# Patient Record
Sex: Female | Born: 1995 | Race: White | Hispanic: No | Smoking: Never smoker
Health system: Southern US, Community
[De-identification: ages and names within clinical notes are randomized; demographics above are authoritative.]

## PROBLEM LIST (undated history)

## (undated) DIAGNOSIS — G932 Benign intracranial hypertension: Secondary | ICD-10-CM

## (undated) HISTORY — PX: HIP SURGERY: SHX245

## (undated) HISTORY — PX: NASAL SINUS SURGERY: SHX719

---

## 2016-04-14 ENCOUNTER — Emergency Department (HOSPITAL_COMMUNITY)
Admission: EM | Admit: 2016-04-14 | Discharge: 2016-04-14 | Disposition: A | Discharge status: Home / Self Care | Payer: BLUE CROSS/BLUE SHIELD | Attending: Emergency Medicine | Admitting: Emergency Medicine

## 2016-04-14 ENCOUNTER — Emergency Department (HOSPITAL_COMMUNITY): Payer: BLUE CROSS/BLUE SHIELD

## 2016-04-14 ENCOUNTER — Encounter (HOSPITAL_COMMUNITY): Payer: Self-pay | Admitting: Nurse Practitioner

## 2016-04-14 DIAGNOSIS — R51 Headache: Secondary | ICD-10-CM | POA: Insufficient documentation

## 2016-04-14 DIAGNOSIS — R200 Anesthesia of skin: Secondary | ICD-10-CM | POA: Diagnosis not present

## 2016-04-14 DIAGNOSIS — R079 Chest pain, unspecified: Secondary | ICD-10-CM | POA: Insufficient documentation

## 2016-04-14 DIAGNOSIS — R519 Headache, unspecified: Secondary | ICD-10-CM

## 2016-04-14 HISTORY — DX: Benign intracranial hypertension: G93.2

## 2016-04-14 LAB — URINALYSIS, ROUTINE W REFLEX MICROSCOPIC
Bilirubin Urine: NEGATIVE
GLUCOSE, UA: NEGATIVE mg/dL
Hgb urine dipstick: NEGATIVE
KETONES UR: NEGATIVE mg/dL
LEUKOCYTES UA: NEGATIVE
Nitrite: NEGATIVE
PROTEIN: NEGATIVE mg/dL
Specific Gravity, Urine: 1.015 (ref 1.005–1.030)
pH: 7.5 (ref 5.0–8.0)

## 2016-04-14 LAB — I-STAT TROPONIN, ED: TROPONIN I, POC: 0 ng/mL (ref 0.00–0.08)

## 2016-04-14 LAB — CBC
HCT: 39.9 % (ref 36.0–46.0)
HEMOGLOBIN: 13.1 g/dL (ref 12.0–15.0)
MCH: 28.1 pg (ref 26.0–34.0)
MCHC: 32.8 g/dL (ref 30.0–36.0)
MCV: 85.6 fL (ref 78.0–100.0)
Platelets: 312 10*3/uL (ref 150–400)
RBC: 4.66 MIL/uL (ref 3.87–5.11)
RDW: 13.8 % (ref 11.5–15.5)
WBC: 7.6 10*3/uL (ref 4.0–10.5)

## 2016-04-14 LAB — BASIC METABOLIC PANEL
ANION GAP: 7 (ref 5–15)
BUN: 12 mg/dL (ref 6–20)
CALCIUM: 9.3 mg/dL (ref 8.9–10.3)
CO2: 21 mmol/L — ABNORMAL LOW (ref 22–32)
Chloride: 108 mmol/L (ref 101–111)
Creatinine, Ser: 0.78 mg/dL (ref 0.44–1.00)
Glucose, Bld: 105 mg/dL — ABNORMAL HIGH (ref 65–99)
Potassium: 3.6 mmol/L (ref 3.5–5.1)
SODIUM: 136 mmol/L (ref 135–145)

## 2016-04-14 LAB — PREGNANCY, URINE: Preg Test, Ur: NEGATIVE

## 2016-04-14 MED ORDER — GI COCKTAIL ~~LOC~~
30.0000 mL | Freq: Once | ORAL | Status: AC
  Administered 2016-04-14: 30 mL via ORAL
  Filled 2016-04-14: qty 30

## 2016-04-14 MED ORDER — KETOROLAC TROMETHAMINE 30 MG/ML IJ SOLN
30.0000 mg | Freq: Once | INTRAMUSCULAR | Status: AC
  Administered 2016-04-14: 30 mg via INTRAVENOUS
  Filled 2016-04-14: qty 1

## 2016-04-14 MED ORDER — SODIUM CHLORIDE 0.9 % IV BOLUS (SEPSIS)
1000.0000 mL | Freq: Once | INTRAVENOUS | Status: AC
Start: 2016-04-14 — End: 2016-04-14
  Administered 2016-04-14: 1000 mL via INTRAVENOUS

## 2016-04-14 NOTE — ED Provider Notes (Signed)
CSN: [id number]     Arrival date & time 04/14/16  1203 History  By signing my name below, I, Tanda RockersMargaux Venter, attest that this documentation has been prepared under the direction and in the presence of Azalia BilisKevin Breslyn Abdo, MD. Electronically Signed: Tanda RockersMargaux Venter, ED Scribe. 04/14/2016. 4:33 PM.   Chief Complaint  Patient presents with  . Numbness  . Chest Pain   The history is provided by the patient and a parent. No language interpreter was used.   HPI Comments: Brandy Perry is a 20 y.o. female with PMHx pseudotumor cerebri, who presents to the Emergency Department complaining of gradual onset, constant, waxing and waning, central chest pain radiating to left chest and underneath left breast x 1.5 weeks, gradually worsening. The chest pain is exacerbated with movement. She also complains of intermittent nausea, shortness of breath, bilateral shoulder pain, and headaches. Pt saw her PCP yesterday for this chest pain and had blood work done with no acute findings. Her PCP believed pt could either have a stomach ulcer or pleurisy and prescribe a medication that pt cannot recall the name of. Pt mentions that while at a therapy session today she began having trouble concentrating, aphasia, and had an outburst of crying when she was not feeling upset. She mentions that she had a "weird" feeling in her tongue and all 4 of her extremities. Pt also felt pre syncopal with dizziness. Mom states that pt has been in a lot of stress lately. Pt has been seeing the therapist after being separated approximately 6 months ago. [facility] [place]. Pt states that she has not had to be drained in awhile. Denies any other associated symptoms.    Past Medical History  Diagnosis Date  . Pseudotumor cerebri    Past Surgical History  Procedure Laterality Date  . Hip surgery    . Nasal sinus surgery     History reviewed. No pertinent family history. Social History   Substance Use Topics  . Smoking status: Never Smoker   . Smokeless tobacco: None  . Alcohol Use: Yes   OB History    No data available     Review of Systems A complete 10 system review of systems was obtained and all systems are negative except as noted in the HPI and PMH.   Allergies  Doxycycline; Nitric oxide; and Penicillins  Home Medications   Prior to Admission medications   Not on File   BP 114/74 mmHg  Pulse 110  Temp(Src) 98.3 F (36.8 C) (Oral)  Resp 18  Ht 5' 2.5" (1.588 m)  Wt 280 lb (127.007 kg)  BMI 50.36 kg/m2  SpO2 98%  LMP 03/28/2016   Physical Exam  Constitutional: She is oriented to person, place, and time. She appears well-developed and well-nourished. No distress.  HENT:  Head: Normocephalic and atraumatic.  Eyes: EOM are normal.  Neck: Normal range of motion.  Cardiovascular: Normal rate, regular rhythm and normal heart sounds.   Pulmonary/Chest: Effort normal and breath sounds normal.  Abdominal: Soft. She exhibits no distension. There is no tenderness.  Musculoskeletal: Normal range of motion.  Neurological: She is alert and oriented to person, place, and time.  Skin: Skin is warm and dry.  Psychiatric: Judgment normal. Her mood appears anxious.  Nursing note and vitals reviewed.   ED Course  Procedures (including critical care time)  DIAGNOSTIC STUDIES: Oxygen Saturation is 98% on RA, normal by my interpretation.    COORDINATION OF CARE: 4:31  PM-Discussed treatment plan which includes CT Head and IV Fluids with pt at bedside and pt agreed to plan.   Labs Review Labs Reviewed  BASIC METABOLIC PANEL - Abnormal; Notable for the following:    CO2 21 (*)    Glucose, Bld 105 (*)    All other components within normal limits  CBC  URINALYSIS, ROUTINE W REFLEX MICROSCOPIC (NOT AT [facility])  PREGNANCY, URINE  I-STAT TROPOININ, ED    Imaging Review Dg Chest 2 View  04/14/2016  CLINICAL DATA:  Chest pain EXAM: CHEST  2 VIEW COMPARISON:   None. FINDINGS: The heart size and mediastinal contours are within normal limits. Both lungs are clear. The visualized skeletal structures are unremarkable. IMPRESSION: No active cardiopulmonary disease. Electronically Signed   By: Marlan Palauharles  Clark M.D.   On: 04/14/2016 13:23   Ct Head Wo Contrast  04/14/2016  CLINICAL DATA:  Headache and dizziness 1-2 weeks. Tingling over face, tongue and extremities today. History of pseudotumor cerebri. EXAM: CT HEAD WITHOUT CONTRAST TECHNIQUE: Contiguous axial images were obtained from the base of the skull through the vertex without intravenous contrast. COMPARISON:  None. FINDINGS: Mild motion artifact. Ventricles, cisterns and other CSF spaces are within normal. No mass, mass effect, shift of midline structures or acute hemorrhage. No evidence of acute infarction. Remaining bones and soft tissues are within normal. IMPRESSION: No acute intracranial findings. Electronically Signed   By: Elberta Fortisaniel  Boyle M.D.   On: 04/14/2016 18:00   I have personally reviewed and evaluated these images and lab results as part of my medical decision-making.   EKG Interpretation   Date/Time:  Thursday April 14 2016 12:56:31 EDT Ventricular Rate:  102 PR Interval:  126 QRS Duration: 82 QT Interval:  352 QTC Calculation: 458 R Axis:   41 Text Interpretation:  Sinus tachycardia Otherwise normal ECG No old  tracing to compare Confirmed by Zubair Lofton  MD, Caryn BeeKEVIN ([id number]) on 04/14/2016  4:29:45 PM      MDM   Final diagnoses:  Chest pain, unspecified chest pain type  Headache, unspecified headache type    Patient is overall well-appearing.  Her multitude of symptoms are nonspecific.  Medical screening examination completed.  Doubt life-threatening emergency.  No indication for additional workup today in the emergency department.  No indication for hospitalization at this time.  Much of this may be related to increasing stress over the past several months.  Primary care follow-up.  She  understands to return to the ER for new or worsening symptoms   I personally performed the services described in this documentation, which was scribed in my presence. The recorded information has been reviewed and is accurate.        Azalia BilisKevin Vada Swift, MD 04/15/16 [phone]

## 2016-04-14 NOTE — ED Notes (Addendum)
Pt c/o onset numbness in her tongue, legs, and chest and upper back pain while at a counseling session. She states she felt anxious during the counseling session, but this did not feel like her typical anxiety. She reports CP, upper back pain, headaches over past week. She was evaluated for this by PCP yesterday but has not heard back on her test results yet. She is alert and breathing easily

## 2017-12-25 IMAGING — CT CT HEAD W/O CM
4 of 6 series · 15 of 47 positions shown, 17 images · non-contrast
Comparison: None.

CLINICAL DATA: Headache and dizziness 1-2 weeks. Tingling over
face, tongue and extremities today. History of pseudotumor cerebri.

EXAM:
CT HEAD WITHOUT CONTRAST
TECHNIQUE: Contiguous axial images were obtained from the base of the skull
through the vertex without intravenous contrast.

[Series 2: head without ax · axial · non-contrast · 0.35mm/px · z∈[+1453,+1542]mm · 4 of 32 slices shown]
[im 7/32  brain]
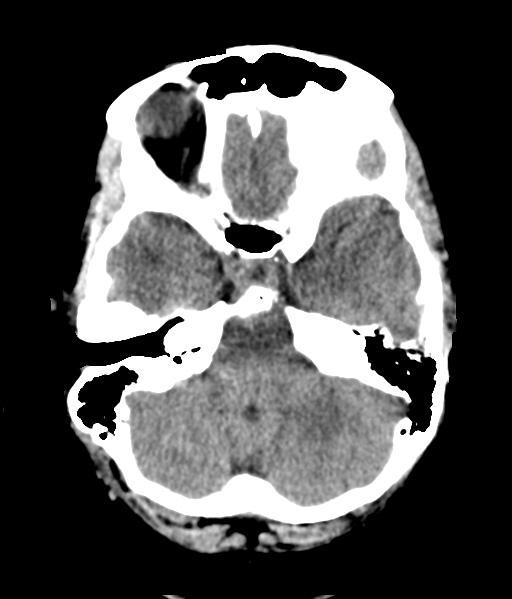
[im 13/32  brain]
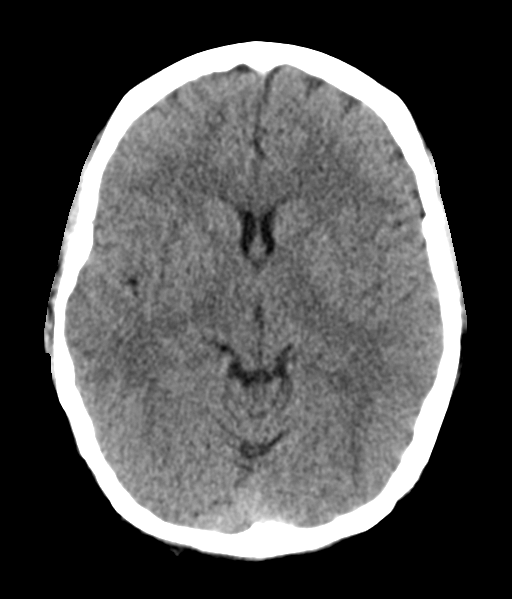
[im 19/32  brain]
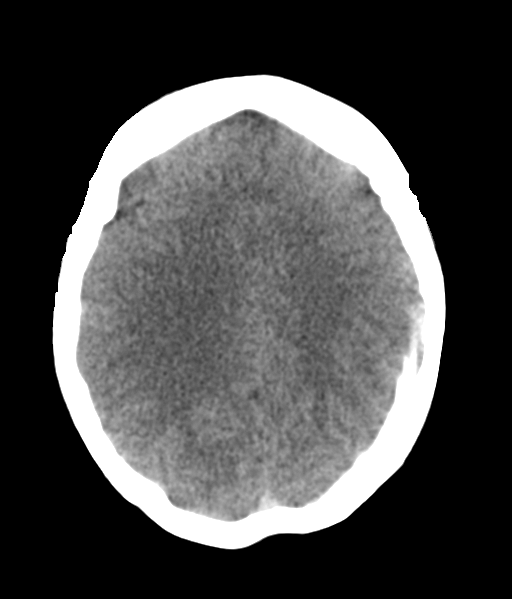
[im 25/32  brain]
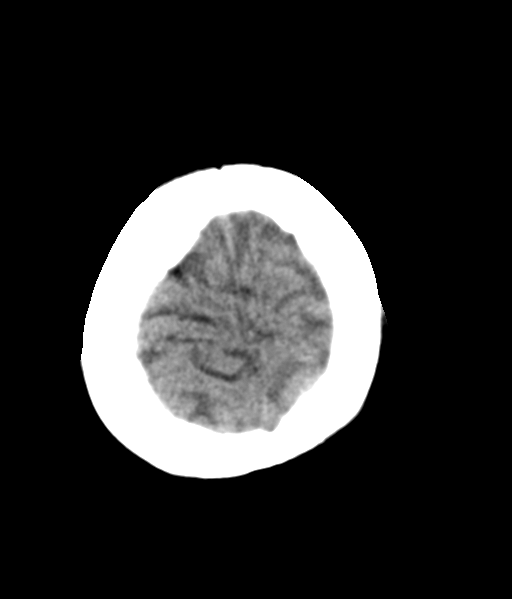

[Series 3: head without · axial · non-contrast · 0.43mm/px · z∈[+1473,+1573]mm · 5 of 32 slices shown, 7 images]
[im 6/32  brain]
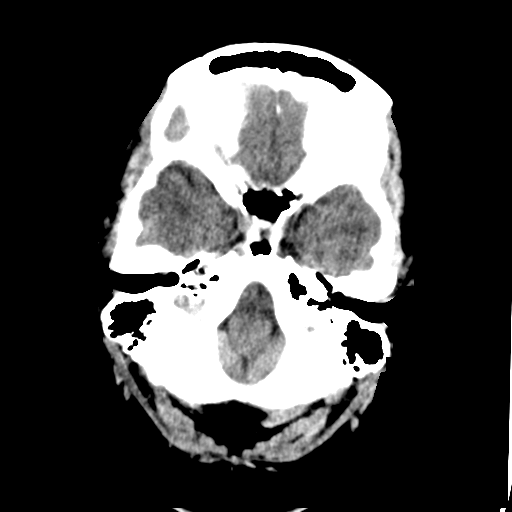
[im 6/32  bone]
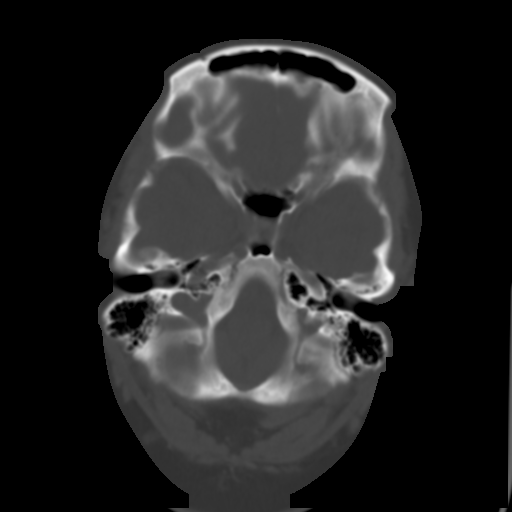
[im 11/32  brain]
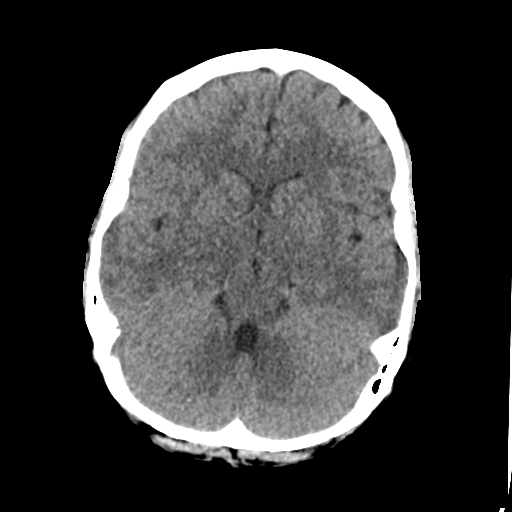
[im 16/32  brain]
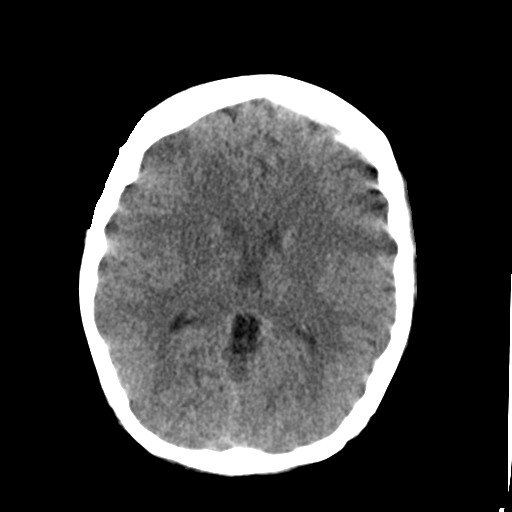
[im 21/32  brain]
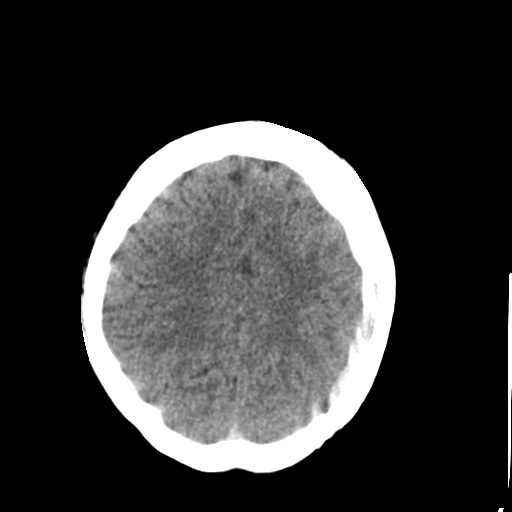
[im 26/32  brain]
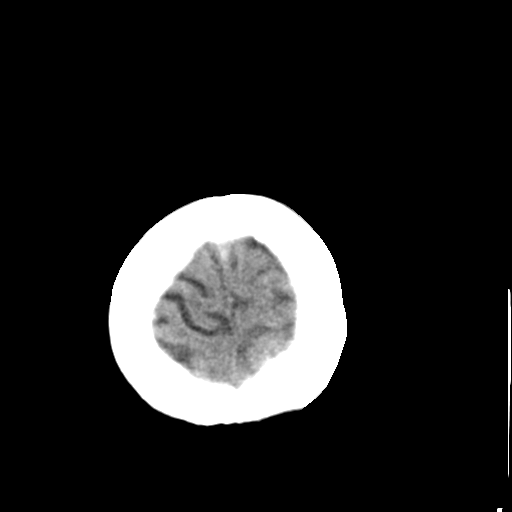
[im 26/32  bone]
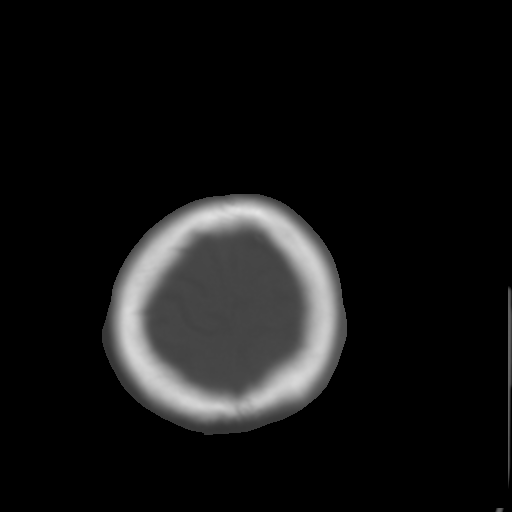

[Series 6: head without sag · sagittal · non-contrast · 0.31mm/px · 3 of 67 slices shown]
[im 23/67  brain]
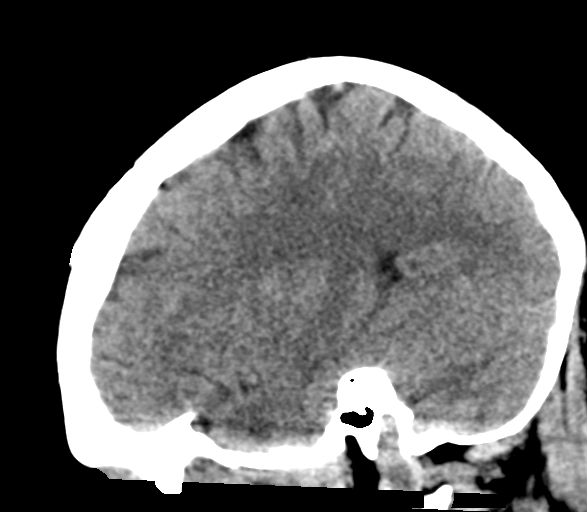
[im 34/67  brain]
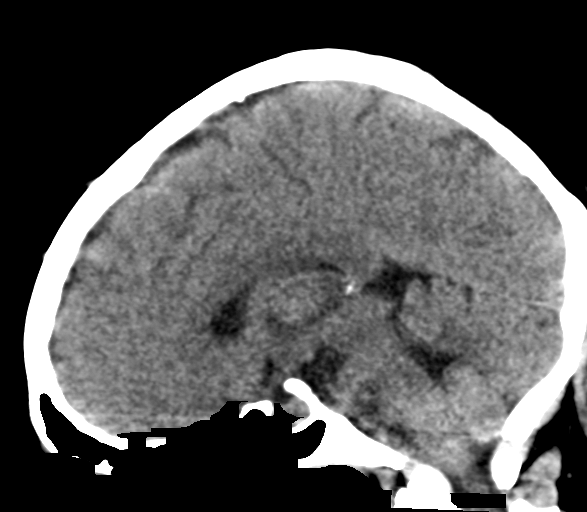
[im 45/67  brain]
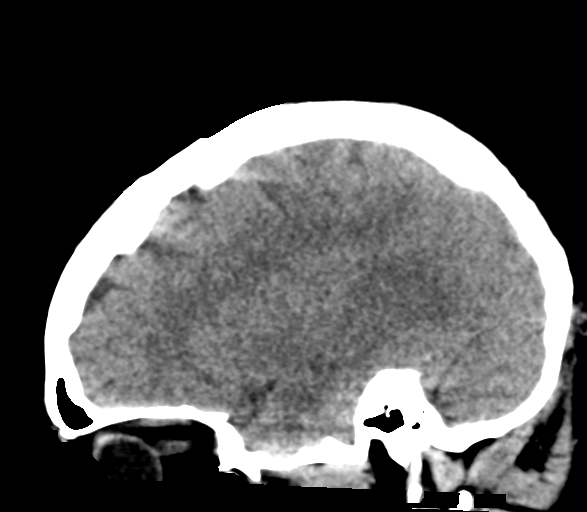

[Series 7: head without cor · coronal · non-contrast · 0.30mm/px · 3 of 61 slices shown]
[im 16/61  brain]
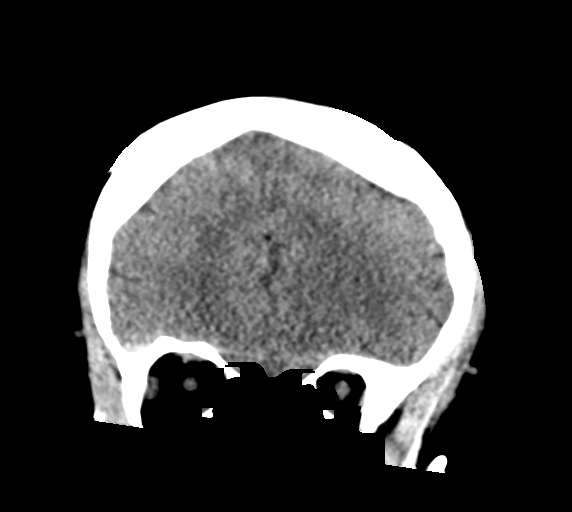
[im 31/61  brain]
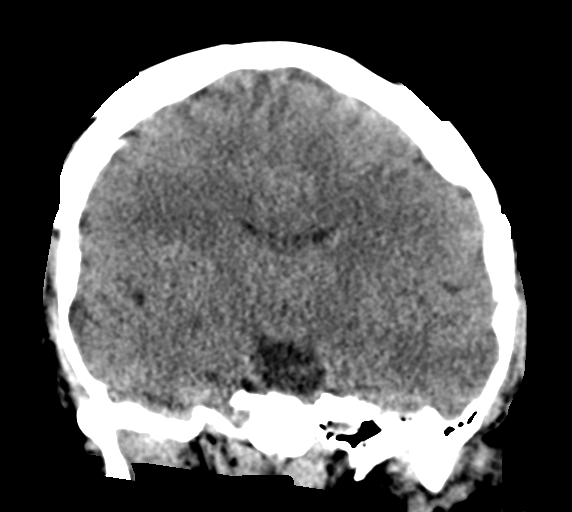
[im 46/61  brain]
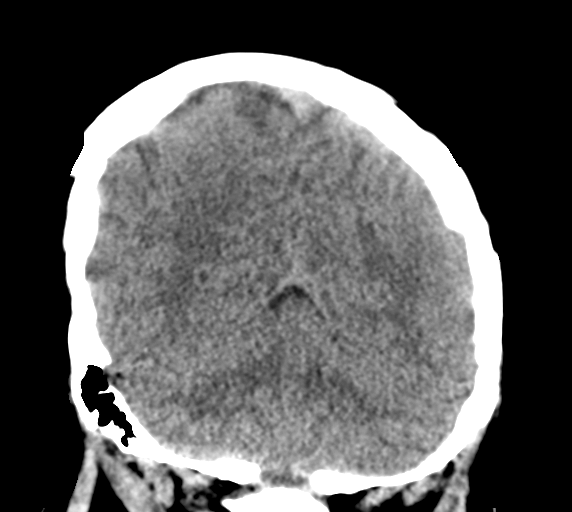

[15 of 47 positions shown; findings below may reference images not displayed]

FINDINGS: Mild motion artifact. Ventricles, cisterns and other CSF spaces are
within normal. No mass, mass effect, shift of midline structures or
acute hemorrhage. No evidence of acute infarction. Remaining bones
and soft tissues are within normal.
IMPRESSION: No acute intracranial findings.

## 2017-12-25 IMAGING — DX DG CHEST 2V
2 series · 2 of 2 positions shown · non-contrast
Comparison: None.

CLINICAL DATA: Chest pain

EXAM:
CHEST  2 VIEW

[chest pa]
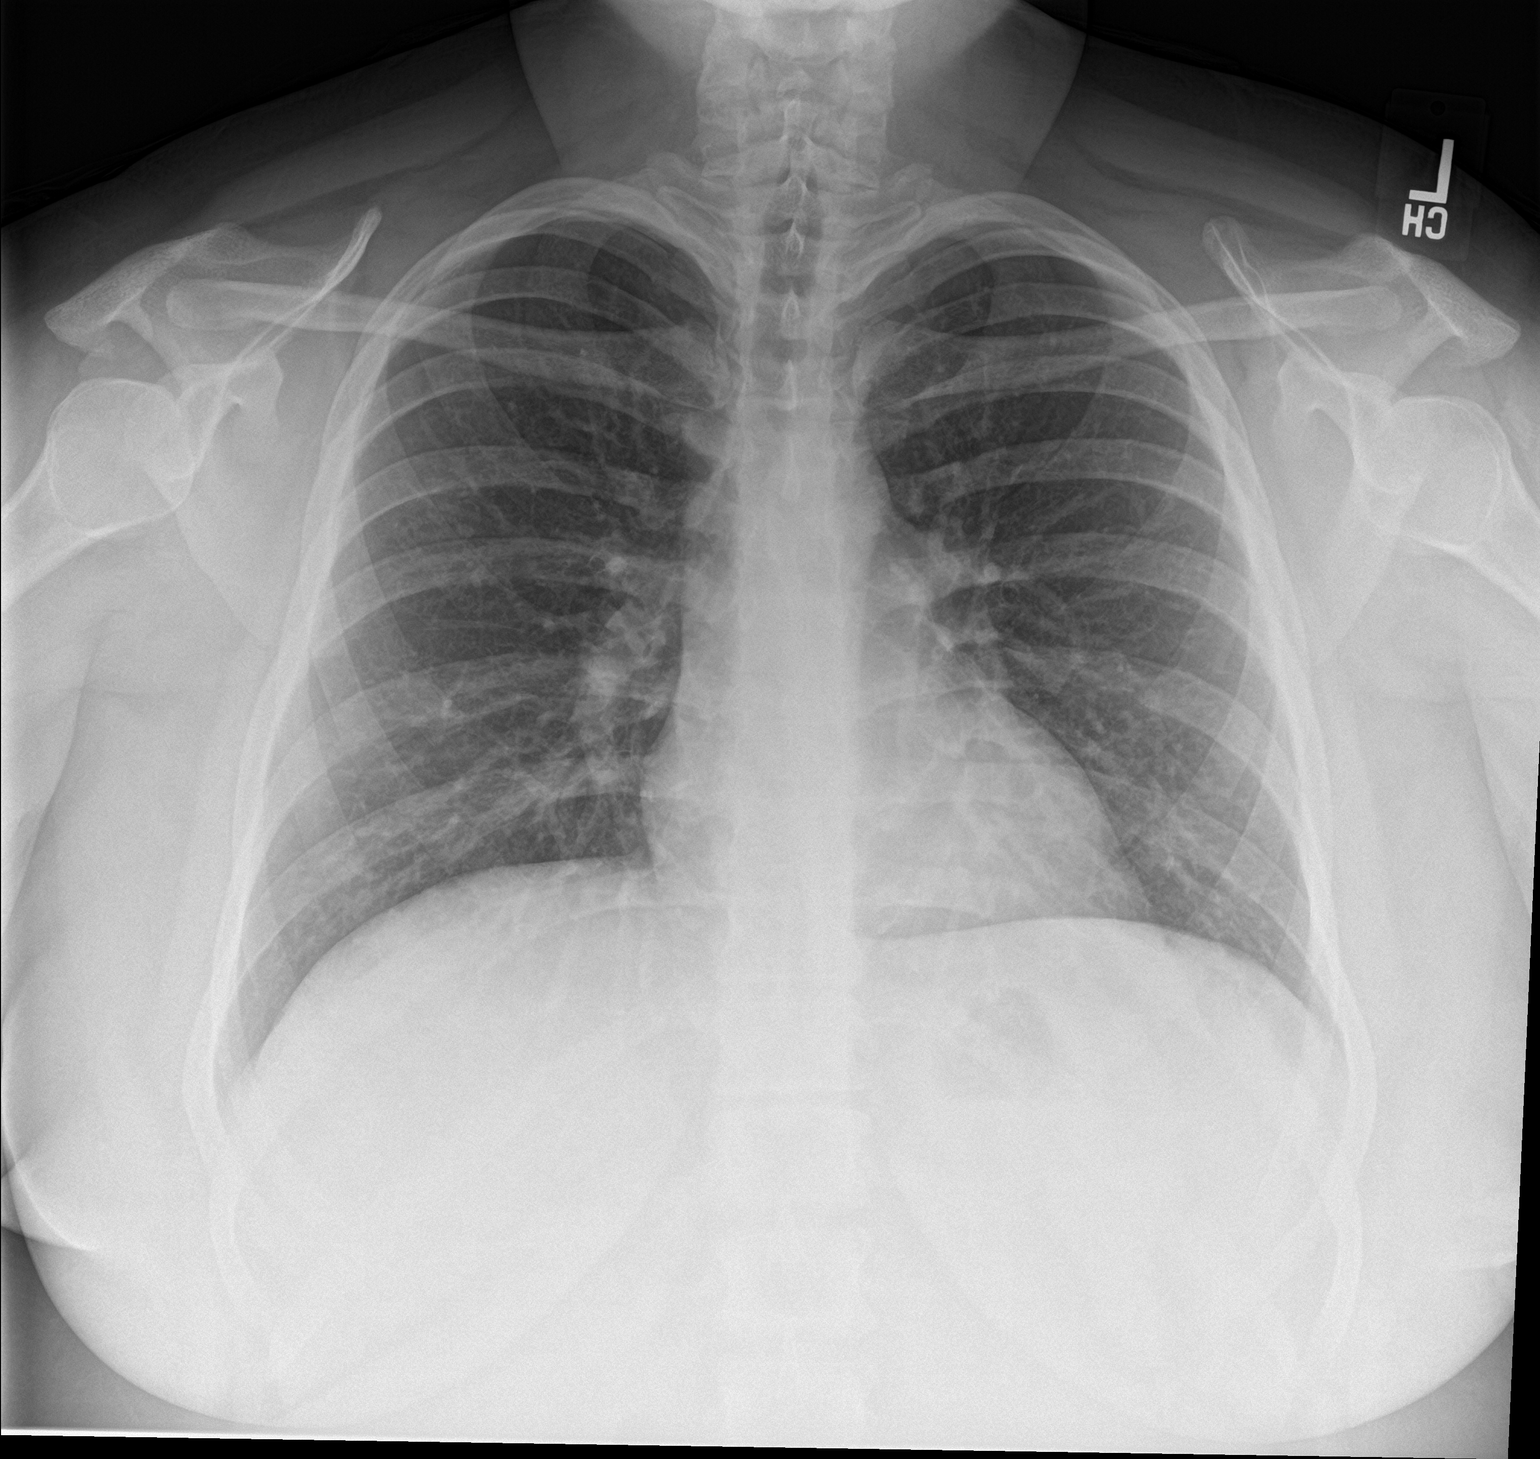

[chest lat]
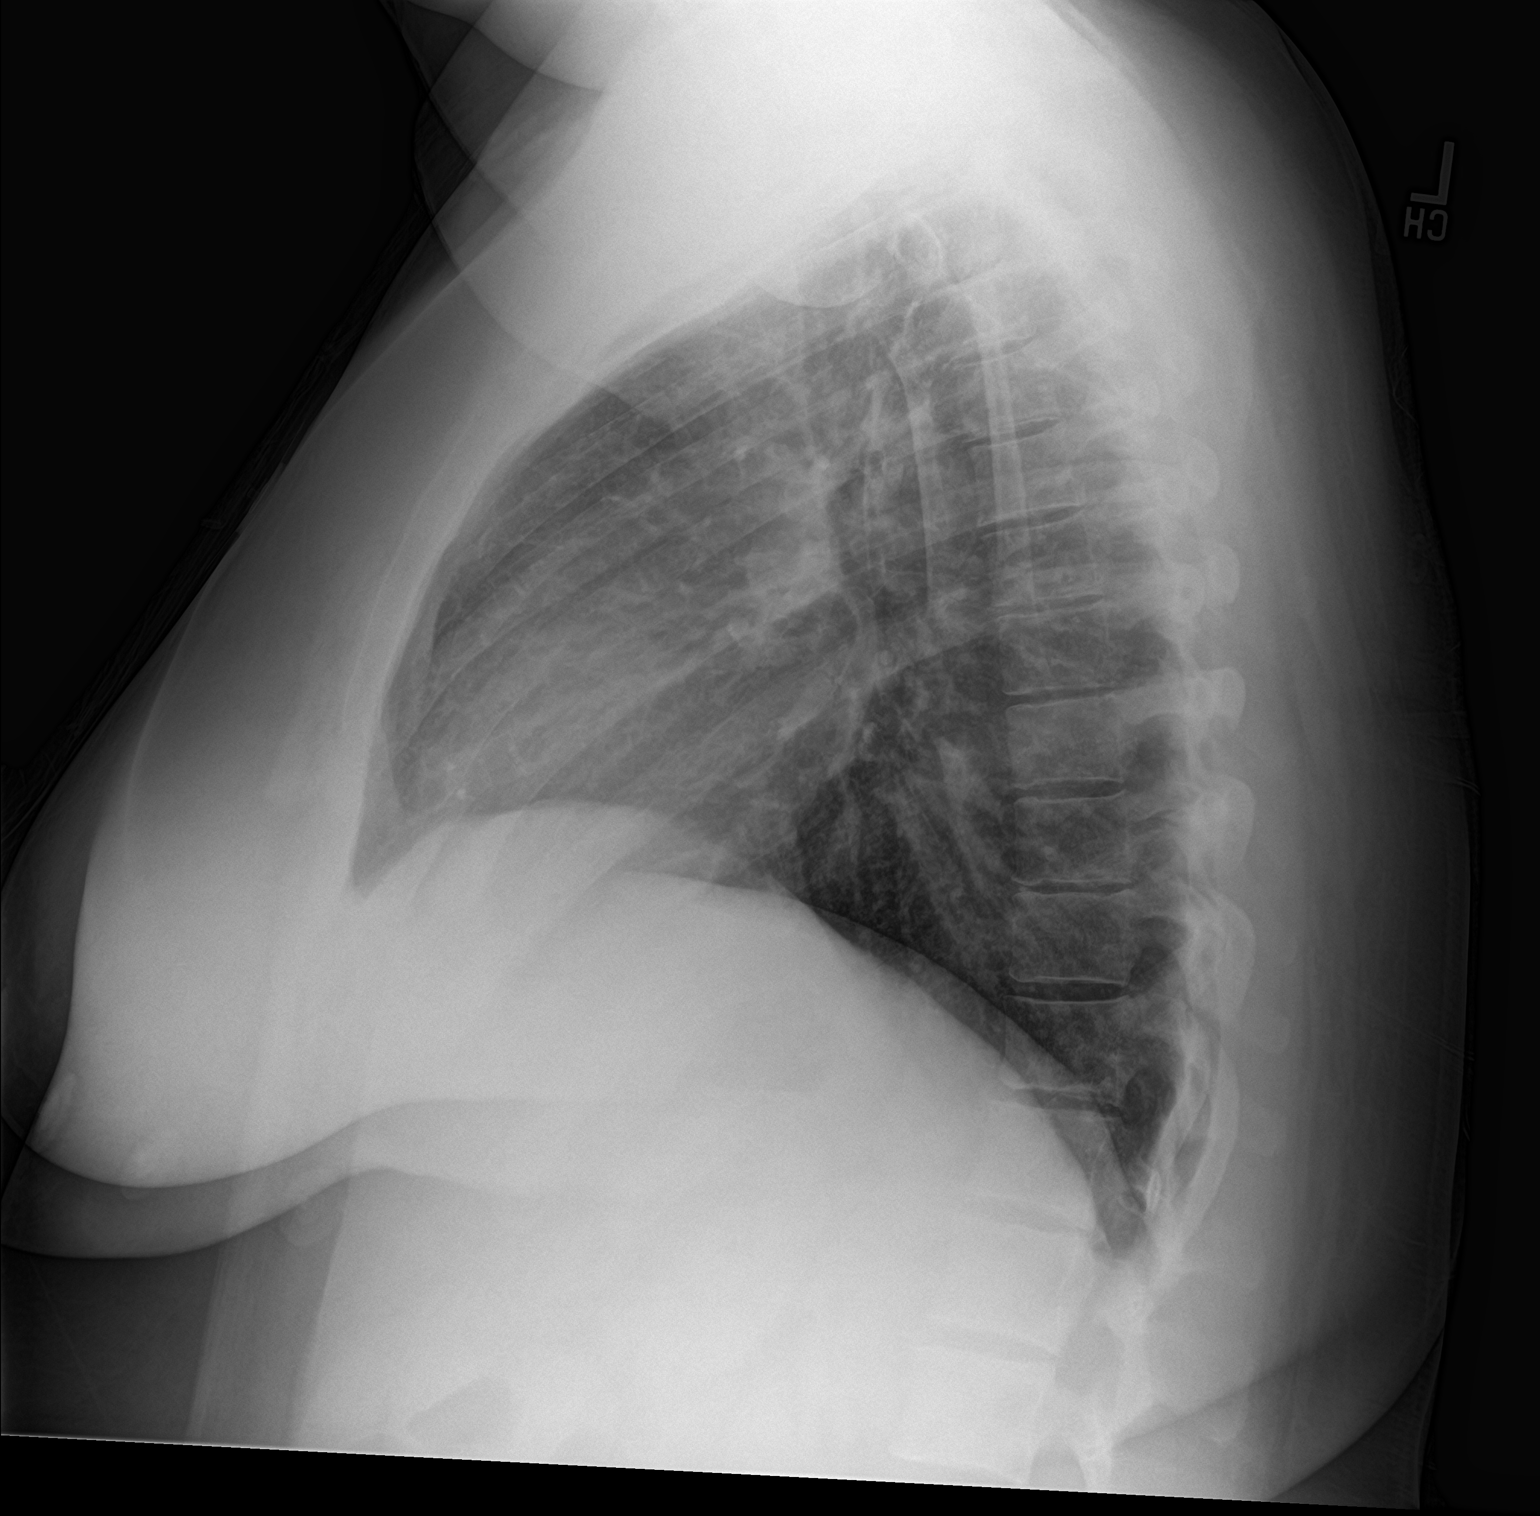

[2 of 2 positions shown; findings below may reference images not displayed]

FINDINGS: The heart size and mediastinal contours are within normal limits.
Both lungs are clear. The visualized skeletal structures are
unremarkable.
IMPRESSION: No active cardiopulmonary disease.
